# Patient Record
Sex: Male | Born: 1995 | Race: Black or African American | Hispanic: No | Marital: Single
Health system: Southern US, Community
[De-identification: ages and names within clinical notes are randomized; demographics above are authoritative.]

## PROBLEM LIST (undated history)

## (undated) DIAGNOSIS — S42209A Unspecified fracture of upper end of unspecified humerus, initial encounter for closed fracture: Secondary | ICD-10-CM

## (undated) DIAGNOSIS — S80219A Abrasion, unspecified knee, initial encounter: Secondary | ICD-10-CM

## (undated) HISTORY — PX: COLON SURGERY: SHX602

---
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
## 2017-07-15 ENCOUNTER — Encounter (HOSPITAL_COMMUNITY): Payer: Self-pay | Admitting: Emergency Medicine

## 2017-07-15 ENCOUNTER — Emergency Department (HOSPITAL_COMMUNITY)
Admission: EM | Admit: 2017-07-15 | Discharge: 2017-07-15 | Disposition: A | Discharge status: Home / Self Care | Payer: Self-pay | Attending: Emergency Medicine | Admitting: Emergency Medicine

## 2017-07-15 DIAGNOSIS — Z7722 Contact with and (suspected) exposure to environmental tobacco smoke (acute) (chronic): Secondary | ICD-10-CM | POA: Insufficient documentation

## 2017-07-15 DIAGNOSIS — Z711 Person with feared health complaint in whom no diagnosis is made: Secondary | ICD-10-CM | POA: Insufficient documentation

## 2017-07-15 DIAGNOSIS — R369 Urethral discharge, unspecified: Secondary | ICD-10-CM | POA: Insufficient documentation

## 2017-07-15 LAB — URINALYSIS, ROUTINE W REFLEX MICROSCOPIC
BACTERIA UA: NONE SEEN
BILIRUBIN URINE: NEGATIVE
Glucose, UA: NEGATIVE mg/dL
HGB URINE DIPSTICK: NEGATIVE
Ketones, ur: NEGATIVE mg/dL
Nitrite: NEGATIVE
PROTEIN: NEGATIVE mg/dL
SPECIFIC GRAVITY, URINE: 1.026 (ref 1.005–1.030)
pH: 5 (ref 5.0–8.0)

## 2017-07-15 MED ORDER — AZITHROMYCIN 250 MG PO TABS
1000.0000 mg | ORAL_TABLET | Freq: Once | ORAL | Status: AC
  Administered 2017-07-15: 1000 mg via ORAL
  Filled 2017-07-15: qty 4

## 2017-07-15 MED ORDER — CEFTRIAXONE SODIUM 250 MG IJ SOLR
250.0000 mg | Freq: Once | INTRAMUSCULAR | Status: AC
  Administered 2017-07-15: 250 mg via INTRAMUSCULAR
  Filled 2017-07-15: qty 250

## 2017-07-15 MED ORDER — LIDOCAINE HCL (PF) 1 % IJ SOLN
INTRAMUSCULAR | Status: AC
  Administered 2017-07-15: 5 mL
  Filled 2017-07-15: qty 5

## 2017-07-15 NOTE — ED Notes (Signed)
Declined W/C at D/C and was escorted to lobby by RN. 
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
## 2017-07-15 NOTE — ED Triage Notes (Signed)
Pt states pain with urination since Wednesday. Denies penile discharge, last known intercourse 1 month ago. Denies exposure to STDS.

## 2017-07-17 LAB — GC/CHLAMYDIA PROBE AMP (~~LOC~~) NOT AT ARMC
Chlamydia: POSITIVE — AB
NEISSERIA GONORRHEA: NEGATIVE
